# Patient Record
Sex: Male | Born: 1995 | Race: Black or African American | Hispanic: No | Marital: Single | Smoking: Never smoker
Health system: Southern US, Community
[De-identification: ages and names within clinical notes are randomized; demographics above are authoritative.]

## PROBLEM LIST (undated history)

## (undated) DIAGNOSIS — J45909 Unspecified asthma, uncomplicated: Secondary | ICD-10-CM

---
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
## 2016-11-28 ENCOUNTER — Emergency Department (HOSPITAL_COMMUNITY)
Admission: EM | Admit: 2016-11-28 | Discharge: 2016-11-28 | Disposition: A | Discharge status: Home / Self Care | Payer: No Typology Code available for payment source | Attending: Emergency Medicine | Admitting: Emergency Medicine

## 2016-11-28 ENCOUNTER — Encounter (HOSPITAL_COMMUNITY): Payer: Self-pay | Admitting: Emergency Medicine

## 2016-11-28 ENCOUNTER — Emergency Department (HOSPITAL_COMMUNITY): Payer: No Typology Code available for payment source

## 2016-11-28 DIAGNOSIS — M25521 Pain in right elbow: Secondary | ICD-10-CM | POA: Diagnosis not present

## 2016-11-28 DIAGNOSIS — J45909 Unspecified asthma, uncomplicated: Secondary | ICD-10-CM | POA: Insufficient documentation

## 2016-11-28 DIAGNOSIS — S3992XA Unspecified injury of lower back, initial encounter: Secondary | ICD-10-CM | POA: Diagnosis present

## 2016-11-28 DIAGNOSIS — Z79899 Other long term (current) drug therapy: Secondary | ICD-10-CM | POA: Diagnosis not present

## 2016-11-28 DIAGNOSIS — Y939 Activity, unspecified: Secondary | ICD-10-CM | POA: Diagnosis not present

## 2016-11-28 DIAGNOSIS — Y999 Unspecified external cause status: Secondary | ICD-10-CM | POA: Diagnosis not present

## 2016-11-28 DIAGNOSIS — M79632 Pain in left forearm: Secondary | ICD-10-CM | POA: Insufficient documentation

## 2016-11-28 DIAGNOSIS — Y92410 Unspecified street and highway as the place of occurrence of the external cause: Secondary | ICD-10-CM | POA: Diagnosis not present

## 2016-11-28 HISTORY — DX: Unspecified asthma, uncomplicated: J45.909

## 2016-11-28 MED ORDER — METHOCARBAMOL 500 MG PO TABS: 500.0000 mg | ORAL_TABLET | Freq: Two times a day (BID) | ORAL | 0 refills | Status: DC

## 2016-11-28 MED ORDER — IBUPROFEN 600 MG PO TABS: 600.0000 mg | ORAL_TABLET | Freq: Four times a day (QID) | ORAL | 0 refills | Status: AC | PRN

## 2016-11-28 NOTE — Discharge Instructions (Signed)
Take your medications as prescribed. I also recommend applying ice and/or heat to affected area for 15-20 minutes 3-4 times daily for additional pain relief. Refrain from doing any heavy lifting, squatting or repetitive movements that exacerbate your symptoms. Follow-up with your primary care provider in the next week if her symptoms have not improved.  Please return to the Emergency Department if symptoms worsen or new onset of Take your medications as prescribed. You may also apply ice and/or heat to affected area for 15-20 minutes 3-4 times daily for additional pain relief. I recommend refraining from doing any heavy lifting, squatting or repetitive movements that exacerbate her symptoms for the next few days. Follow-up with your primary care provider in the next week if her symptoms have not improved. Please return to the Emergency Department if symptoms worsen or new onset of denies fever, headache, numbness, tingling, groin anesthesia, loss of bowel or bladder, weakness, chest pain, abdominal pain, vomiting.

## 2016-11-28 NOTE — ED Notes (Signed)
PA at bedside.
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
## 2019-03-01 ENCOUNTER — Emergency Department (HOSPITAL_BASED_OUTPATIENT_CLINIC_OR_DEPARTMENT_OTHER)
Admission: EM | Admit: 2019-03-01 | Discharge: 2019-03-01 | Disposition: A | Discharge status: Home / Self Care | Payer: BLUE CROSS/BLUE SHIELD | Attending: Emergency Medicine | Admitting: Emergency Medicine

## 2019-03-01 ENCOUNTER — Encounter (HOSPITAL_BASED_OUTPATIENT_CLINIC_OR_DEPARTMENT_OTHER): Payer: Self-pay | Admitting: *Deleted

## 2019-03-01 ENCOUNTER — Other Ambulatory Visit: Payer: Self-pay

## 2019-03-01 DIAGNOSIS — Y929 Unspecified place or not applicable: Secondary | ICD-10-CM | POA: Diagnosis not present

## 2019-03-01 DIAGNOSIS — Y998 Other external cause status: Secondary | ICD-10-CM | POA: Insufficient documentation

## 2019-03-01 DIAGNOSIS — Z79899 Other long term (current) drug therapy: Secondary | ICD-10-CM | POA: Insufficient documentation

## 2019-03-01 DIAGNOSIS — S0502XA Injury of conjunctiva and corneal abrasion without foreign body, left eye, initial encounter: Secondary | ICD-10-CM | POA: Diagnosis not present

## 2019-03-01 DIAGNOSIS — W228XXA Striking against or struck by other objects, initial encounter: Secondary | ICD-10-CM | POA: Insufficient documentation

## 2019-03-01 DIAGNOSIS — J45909 Unspecified asthma, uncomplicated: Secondary | ICD-10-CM | POA: Insufficient documentation

## 2019-03-01 DIAGNOSIS — S0592XA Unspecified injury of left eye and orbit, initial encounter: Secondary | ICD-10-CM | POA: Diagnosis present

## 2019-03-01 DIAGNOSIS — Y9389 Activity, other specified: Secondary | ICD-10-CM | POA: Diagnosis not present

## 2019-03-01 MED ORDER — TETRACAINE HCL 0.5 % OP SOLN
2.0000 [drp] | Freq: Once | OPHTHALMIC | Status: AC
  Administered 2019-03-01: 14:00:00 2 [drp] via OPHTHALMIC
  Filled 2019-03-01: qty 4

## 2019-03-01 MED ORDER — FLUORESCEIN SODIUM 1 MG OP STRP
1.0000 | ORAL_STRIP | Freq: Once | OPHTHALMIC | Status: AC
  Administered 2019-03-01: 1 via OPHTHALMIC
  Filled 2019-03-01: qty 1

## 2019-03-01 MED ORDER — HYDROCODONE-ACETAMINOPHEN 5-325 MG PO TABS: 1.0000 | ORAL_TABLET | ORAL | 0 refills | Status: DC | PRN

## 2019-03-01 MED ORDER — ERYTHROMYCIN 5 MG/GM OP OINT: TOPICAL_OINTMENT | OPHTHALMIC | 0 refills | Status: DC
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
## 2019-03-01 NOTE — Discharge Instructions (Signed)
Make sure that you follow-up with an ophthalmologist in the next 1 to 2 days to get a complete eye exam.  Use antibiotic ointment as directed.  Return to the emergency department for any worsening symptoms.
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
## 2019-08-13 ENCOUNTER — Emergency Department (HOSPITAL_BASED_OUTPATIENT_CLINIC_OR_DEPARTMENT_OTHER): Payer: BLUE CROSS/BLUE SHIELD

## 2019-08-13 ENCOUNTER — Other Ambulatory Visit: Payer: Self-pay

## 2019-08-13 ENCOUNTER — Encounter (HOSPITAL_BASED_OUTPATIENT_CLINIC_OR_DEPARTMENT_OTHER): Payer: Self-pay

## 2019-08-13 ENCOUNTER — Emergency Department (HOSPITAL_BASED_OUTPATIENT_CLINIC_OR_DEPARTMENT_OTHER)
Admission: EM | Admit: 2019-08-13 | Discharge: 2019-08-13 | Disposition: A | Discharge status: Home / Self Care | Payer: BLUE CROSS/BLUE SHIELD | Attending: Emergency Medicine | Admitting: Emergency Medicine

## 2019-08-13 DIAGNOSIS — W01118A Fall on same level from slipping, tripping and stumbling with subsequent striking against other sharp object, initial encounter: Secondary | ICD-10-CM | POA: Insufficient documentation

## 2019-08-13 DIAGNOSIS — Y929 Unspecified place or not applicable: Secondary | ICD-10-CM | POA: Insufficient documentation

## 2019-08-13 DIAGNOSIS — J45909 Unspecified asthma, uncomplicated: Secondary | ICD-10-CM | POA: Diagnosis not present

## 2019-08-13 DIAGNOSIS — Y939 Activity, unspecified: Secondary | ICD-10-CM | POA: Insufficient documentation

## 2019-08-13 DIAGNOSIS — S61216A Laceration without foreign body of right little finger without damage to nail, initial encounter: Secondary | ICD-10-CM | POA: Insufficient documentation

## 2019-08-13 DIAGNOSIS — Y999 Unspecified external cause status: Secondary | ICD-10-CM | POA: Insufficient documentation

## 2019-08-13 MED ORDER — LIDOCAINE HCL 2 % IJ SOLN
INTRAMUSCULAR | Status: AC
  Administered 2019-08-13: 400 mg
  Filled 2019-08-13: qty 20

## 2019-08-13 MED ORDER — LIDOCAINE HCL (PF) 1 % IJ SOLN: 5.0000 mL | Freq: Once | INTRAMUSCULAR | Status: DC

## 2019-08-13 NOTE — ED Triage Notes (Addendum)
Pt states he cut right pinky finger with metal ~45 min PTA-jagged lac noted at first joint-NAD-steady gait

## 2019-08-13 NOTE — ED Provider Notes (Signed)
[facility] EMERGENCY DEPARTMENT Provider Note   CSN: [id number] Arrival date & time: 08/13/19  1426     History   Chief Complaint Chief Complaint  Patient presents with  . Finger Injury    HPI Bobby Reynolds is a 23 y.o. male who presents emergency department chief complaint of finger laceration.  Patient states that he fell backward and caught his pinky on a metal edge of a door.  This happened about 2 hours prior to my evaluation.  He is up-to-date on his tetanus vaccination had it last year.  Denies any numbness or tingling.  He denies any weakness of the finger.     HPI  Past Medical History:  Diagnosis Date  . Asthma     There are no active problems to display for this patient.   History reviewed. No pertinent surgical history.      Home Medications    Prior to Admission medications   Medication Sig Start Date End Date Taking? Authorizing Provider  ibuprofen (ADVIL,MOTRIN) 600 MG tablet Take 1 tablet (600 mg total) by mouth every 6 (six) hours as needed. 11/28/16   Nona Dell, PA-C    Family History No family history on file.  Social History Social History   Tobacco Use  . Smoking status: Never Smoker  . Smokeless tobacco: Never Used  Substance Use Topics  . Alcohol use: Yes    Comment: occ  . Drug use: Never     Allergies   Patient has no known allergies.   Review of Systems Review of Systems  Musculoskeletal: Negative for joint swelling.  Skin: Positive for wound.  Neurological: Negative for weakness and numbness.     Physical Exam Updated Vital Signs BP 137/81 (BP Location: Left Arm)   Pulse 72   Temp 98.2 F (36.8 C) (Oral)   Resp 16   Ht 5\' 8"  (1.727 m)   Wt 89.4 kg   SpO2 98%   BMI 29.95 kg/m   Physical Exam Vitals signs and nursing note reviewed.  Constitutional:      General: He is not in acute distress.    Appearance: He is well-developed. He is not diaphoretic.  HENT:     Head:  Normocephalic and atraumatic.  Eyes:     General: No scleral icterus.    Conjunctiva/sclera: Conjunctivae normal.  Neck:     Musculoskeletal: Normal range of motion and neck supple.  Cardiovascular:     Rate and Rhythm: Normal rate and regular rhythm.     Heart sounds: Normal heart sounds.  Pulmonary:     Effort: Pulmonary effort is normal. No respiratory distress.     Breath sounds: Normal breath sounds.  Abdominal:     Palpations: Abdomen is soft.     Tenderness: There is no abdominal tenderness.  Musculoskeletal:     Comments: Right pinky finger with elliptical laceration proximal to the DIP joint on the palmar surface of the finger.  Normal range of motion at the DIP PIP and MCP.  AIN/PIN/U- 5/5 SILT M/R/U Cap refill <2     Skin:    General: Skin is warm and dry.  Neurological:     Mental Status: He is alert.  Psychiatric:        Behavior: Behavior normal.      ED Treatments / Results  Labs (all labs ordered are listed, but only abnormal results are displayed) Labs Reviewed - No data to display  EKG None  Radiology Dg Finger Little  Right  Result Date: 08/13/2019 CLINICAL DATA:  Laceration of the little finger of the right hand. EXAM: RIGHT LITTLE FINGER 2+V COMPARISON:  None. FINDINGS: There is a soft tissue laceration of the volar aspect of the little finger at the level of the DIP joint. No bone abnormality. No radiodense foreign body in the soft tissues. IMPRESSION: Soft tissue laceration. No bone abnormality. No radiodense foreign body. Electronically Signed   By: Francene Boyers M.D.   On: 08/13/2019 15:10    Procedures .Marland KitchenLaceration Repair  Date/Time: 08/13/2019 4:35 PM Performed by: Arthor Captain, PA-C Authorized by: Arthor Captain, PA-C   Consent:    Consent obtained:  Verbal   Consent given by:  Patient   Risks discussed:  Infection, need for additional repair, pain, poor cosmetic result and poor wound healing   Alternatives discussed:  No  treatment and delayed treatment Universal protocol:    Procedure explained and questions answered to patient or proxy's satisfaction: yes     Relevant documents present and verified: yes     Test results available and properly labeled: yes     Imaging studies available: yes     Required blood products, implants, devices, and special equipment available: yes     Site/side marked: yes     Immediately prior to procedure, a time out was called: yes     Patient identity confirmed:  Verbally with patient Anesthesia (see MAR for exact dosages):    Anesthesia method:  Local infiltration   Local anesthetic:  Lidocaine 2% w/o epi Laceration details:    Location:  Finger   Finger location:  R small finger   Length (cm):  4 Repair type:    Repair type:  Intermediate Pre-procedure details:    Preparation:  Patient was prepped and draped in usual sterile fashion Exploration:    Wound exploration: wound explored through full range of motion and entire depth of wound probed and visualized     Wound extent: muscle damage   Treatment:    Area cleansed with:  Hibiclens   Amount of cleaning:  Standard   Irrigation solution:  Sterile saline   Irrigation method:  Pressure wash Subcutaneous repair:    Suture size:  5-0   Suture material:  Vicryl   Suture technique:  Simple interrupted Skin repair:    Repair method:  Sutures   Suture size:  4-0   Suture material:  Prolene   Suture technique:  Simple interrupted and running locked   Number of sutures:  11 Approximation:    Approximation:  Close Post-procedure details:    Dressing:  Bulky dressing   Patient tolerance of procedure:  Tolerated well, no immediate complications   (including critical care time)  Medications Ordered in ED Medications  lidocaine (XYLOCAINE) 2 % (with pres) injection (has no administration in time range)  lidocaine (PF) (XYLOCAINE) 1 % injection 5 mL (has no administration in time range)     Initial Impression /  Assessment and Plan / ED Course  I have reviewed the triage vital signs and the nursing notes.  Pertinent labs & imaging results that were available during my care of the patient were reviewed by me and considered in my medical decision making (see chart for details).        Tdap utd.Pressure irrigation performed. Laceration occurred < 8 hours prior to repair which was well tolerated. Pt has no co morbidities to effect normal wound healing. Discussed suture home care w pt and answered questions. Pt  to f-u for wound check and suture removal in 7 days. Pt is hemodynamically stable w no complaints prior to dc.     Final Clinical Impressions(s) / ED Diagnoses   Final diagnoses:  Laceration of right little finger without foreign body without damage to nail, initial encounter    ED Discharge Orders    None       Arthor CaptainHarris, Ysabelle Goodroe, PA-C 08/13/19 1637    Raeford RazorKohut, Stephen, MD 08/14/19 1005

## 2019-08-13 NOTE — ED Notes (Signed)
Patient transported to X-ray 

## 2019-08-13 NOTE — Discharge Instructions (Addendum)
WOUND CARE °Please have your stitches/staples removed in 7-10 or sooner if you have concerns. You may do this at any available urgent care or at your primary care doctor's office. ° Keep area clean and dry for 24 hours. Do not remove °bandage, if applied. ° After 24 hours, remove bandage and wash wound °gently with mild soap and warm water. Reapply °a new bandage after cleaning wound, if directed. ° Continue daily cleansing with soap and water until °stitches/staples are removed. ° Do not apply any ointments or creams to the wound °while stitches/staples are in place, as this may cause °delayed healing. ° Seek medical careif you experience any of the following °signs of infection: Swelling, redness, pus drainage, °streaking, fever >101.0 F ° Seek care if you experience excessive bleeding °that does not stop after 15-20 minutes of constant, firm °pressure. ° ° ° ° °

## 2020-12-27 IMAGING — CR DG FINGER LITTLE 2+V*R*
3 series · 3 of 3 positions shown · non-contrast
Comparison: None.

CLINICAL DATA: Laceration of the little finger of the right hand.

EXAM:
RIGHT LITTLE FINGER 2+V

[x finger pa right]
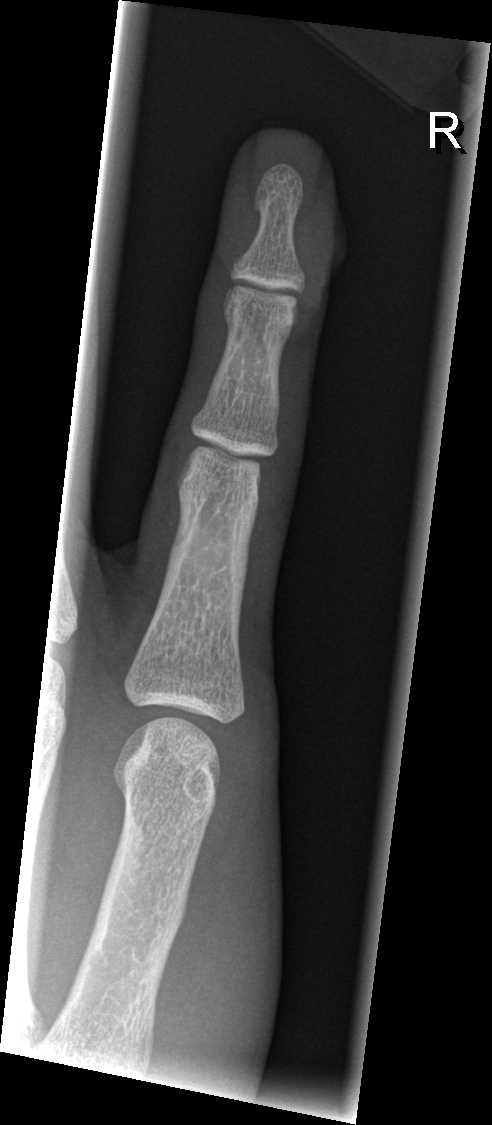

[x finger obl. right]
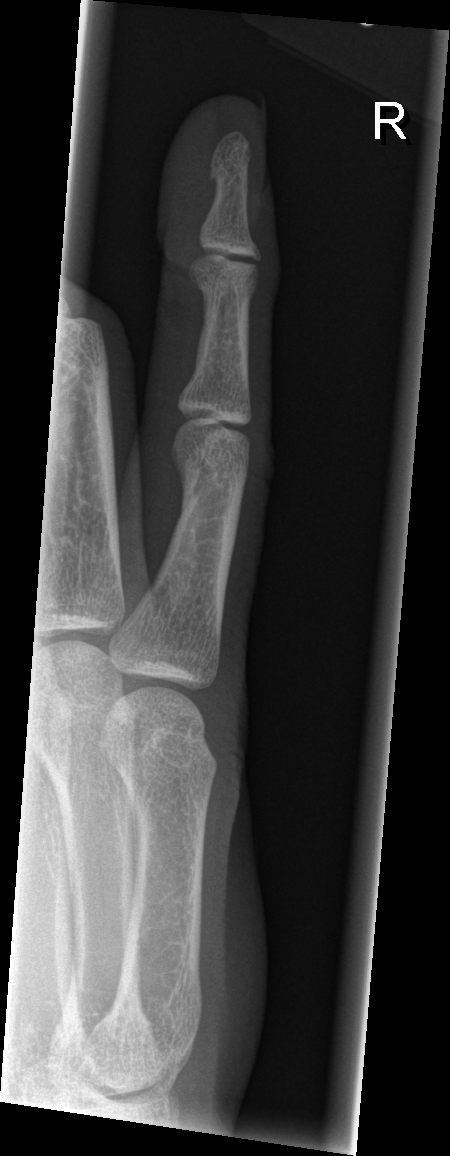

[x finger lateral right]
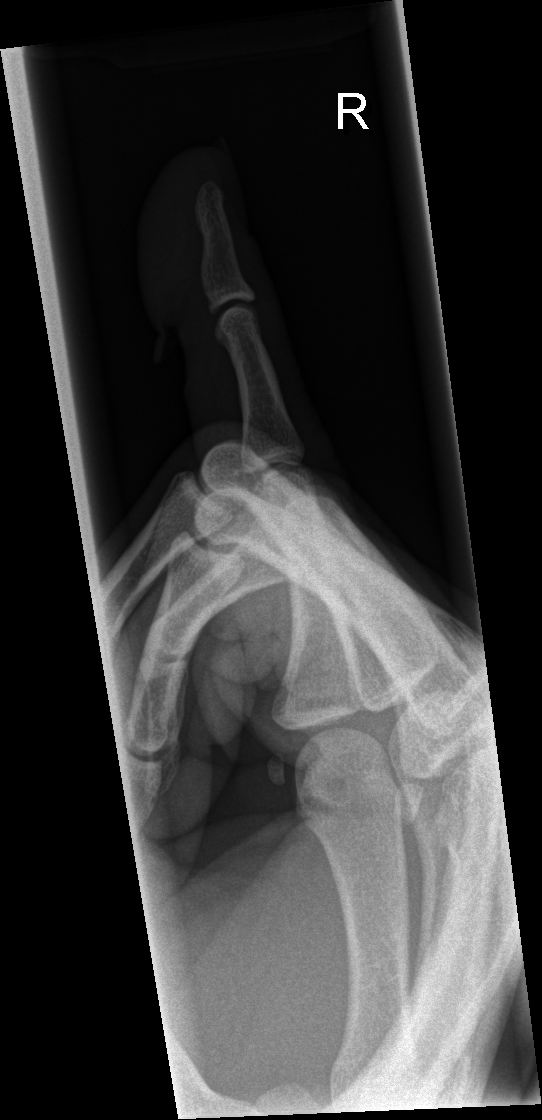

[3 of 3 positions shown; findings below may reference images not displayed]

FINDINGS: There is a soft tissue laceration of the volar aspect of the little
finger at the level of the DIP joint. No bone abnormality. No
radiodense foreign body in the soft tissues.
IMPRESSION: Soft tissue laceration. No bone abnormality. No radiodense foreign
body.
